# Patient Record
Sex: Female | Born: 1985 | Race: White | Marital: Single | Smoking: Never smoker
Health system: Southern US, Community
[De-identification: ages and names within clinical notes are randomized; demographics above are authoritative.]

---

## 2014-03-23 ENCOUNTER — Ambulatory Visit: Payer: Self-pay | Admitting: Physician Assistant

## 2014-03-23 LAB — RAPID STREP-A WITH REFLX: MICRO TEXT REPORT: NEGATIVE

## 2014-03-27 LAB — BETA STREP CULTURE(ARMC)

## 2017-07-01 ENCOUNTER — Ambulatory Visit
Admission: EM | Admit: 2017-07-01 | Discharge: 2017-07-01 | Disposition: A | Discharge status: Home / Self Care | Payer: BLUE CROSS/BLUE SHIELD | Attending: Family Medicine | Admitting: Family Medicine

## 2017-07-01 ENCOUNTER — Ambulatory Visit (INDEPENDENT_AMBULATORY_CARE_PROVIDER_SITE_OTHER): Payer: BLUE CROSS/BLUE SHIELD

## 2017-07-01 DIAGNOSIS — M545 Low back pain, unspecified: Secondary | ICD-10-CM

## 2017-07-01 MED ORDER — MELOXICAM 15 MG PO TABS: 15.0000 mg | ORAL_TABLET | Freq: Every day | ORAL | 0 refills | Status: AC | PRN

## 2017-07-01 MED ORDER — CYCLOBENZAPRINE HCL 5 MG PO TABS: 5.0000 mg | ORAL_TABLET | Freq: Two times a day (BID) | ORAL | 0 refills | Status: AC | PRN

## 2017-07-01 MED ORDER — KETOROLAC TROMETHAMINE 30 MG/ML IJ SOLN
30.0000 mg | Freq: Once | INTRAMUSCULAR | Status: AC
  Administered 2017-07-01: 30 mg via INTRAMUSCULAR

## 2017-07-01 NOTE — ED Triage Notes (Signed)
Pt states she was brushing her teeth this morning bending over the sink. Went to stand up and felt a "shock" states then she experienced nausea, eye sight went black and now having consistent back pain but the other symptoms disappeared. Hurts to sit, stand or any movement. No otc meds tried.

## 2017-07-01 NOTE — ED Provider Notes (Addendum)
[facility] ____________________________________________  Time seen: Approximately 2:44 PM  I have reviewed the triage vital signs and the nursing notes.   HISTORY  Chief Complaint Back Pain   HPI Valerie Leblanc is a 32 y.o. female presenting with family bedside for evaluation of low back pain that started this morning.  Patient reports that she was bent over brushing her teeth this morning, but when she went to stand up straight she felt sudden onset of pain in her low back.  Patient states that she then laid down and then with first attempt of getting up she had nausea and feeling like she might blackout, but no syncope or occurred.  Laid back down and waited a few minutes, and then attempted to get up again she did not have as much pain or near blacking out sensation.  States pain is primarily with position changes.  States if lying flat or standing straight minimal pain.  Has continued to remain ambulatory.  Has not tried anything over-the-counter today.  Denies pain radiation, paresthesias, urinary or bowel retention or incontinence, direct fall or direct trauma.  No rash, dysuria, fevers, recent sickness or other complaints.  Reports otherwise feels well.  Denies history of the same.  States does have some history of intermittent low back pain that is only present after overexertion.  Denies any current chest pain or shortness of breath, vision changes, headache, weakness, dizziness, or other complaints.  Patient's last menstrual period was 06/26/2017 (within days).Denies pregnancy.  History reviewed. No pertinent past medical history.  There are no active problems to display for this patient.   History reviewed. No pertinent surgical history.   No current facility-administered medications for this encounter.   Current Outpatient Medications:  .  cyclobenzaprine (FLEXERIL) 5 MG tablet, Take 1 tablet (5 mg total) by mouth 2 (two) times daily as needed for muscle spasms.  Do not drive as can cause drowsiness., Disp: 14 tablet, Rfl: 0 .  meloxicam (MOBIC) 15 MG tablet, Take 1 tablet (15 mg total) by mouth daily as needed., Disp: 10 tablet, Rfl: 0  Allergies Patient has no known allergies.  No family history on file.  Social History Social History   Tobacco Use  . Smoking status: Not on file  Substance Use Topics  . Alcohol use: Not on file  . Drug use: Not on file    Review of Systems Constitutional: No fever/chills Eyes: No visual changes. Cardiovascular: Denies chest pain. Respiratory: Denies shortness of breath. Gastrointestinal: No abdominal pain.  No nausea, no vomiting.  No diarrhea.  No constipation. Genitourinary: Negative for dysuria. Musculoskeletal: positive for back pain. Skin: Negative for rash. Neurological: Negative for headaches, focal weakness or numbness.   ____________________________________________   PHYSICAL EXAM:  VITAL SIGNS: ED Triage Vitals  Enc Vitals Group     BP 07/01/17 1347 (!) 82/55     Pulse Rate 07/01/17 1347 81     Resp 07/01/17 1347 18     Temp 07/01/17 1347 97.9 F (36.6 C)     Temp Source 07/01/17 1347 Oral     SpO2 07/01/17 1347 97 %     Weight --      Height --      Head Circumference --      Peak Flow --      Pain Score 07/01/17 1349 3     Pain Loc --      Pain Edu? --      Excl. in GC? --  Vitals:   07/01/17 1347 07/01/17 1559 07/01/17 1600  BP: (!) 82/55  (!) 98/56  Pulse: 81    Resp: 18    Temp: 97.9 F (36.6 C)    TempSrc: Oral    SpO2: 97%      Constitutional: Alert and oriented. Well appearing and in no acute distress. Eyes: Conjunctivae are normal. ENT      Head: Normocephalic and atraumatic.      Mouth/Throat: Mucous membranes are moist.Oropharynx non-erythematous. Neck: No stridor. Supple without meningismus.  Hematological/Lymphatic/Immunilogical: No cervical lymphadenopathy. Cardiovascular: Normal rate, regular rhythm. Grossly normal heart sounds.  Good  peripheral circulation. Respiratory: Normal respiratory effort without tachypnea nor retractions. Breath sounds are clear and equal bilaterally. No wheezes, rales, rhonchi. Gastrointestinal: Soft and nontender.  Normal Bowel sounds. No CVA tenderness. Musculoskeletal:  No midline cervical or thoracic tenderness to palpation. Bilateral pedal pulses equal and easily palpated.  2+ patellar and Achilles reflexes bilaterally.      Right lower leg:  No tenderness or edema.      Left lower leg:  No tenderness or edema.  Except midline: Lower lumbar at approximately lumbar 3-5 mild diffuse tenderness to palpation as well as left paralumbar tenderness, no rash, no swelling, no ecchymosis, no saddle anesthesia, moderate pain with supine to sitting positions, pain with bilateral straight leg raises left greater than right, minimal pain with standing knee lifts, able to ambulate with a steady gait with minimal pain per patient.  Neurologic:  Normal speech and language. No gross focal neurologic deficits are appreciated. Speech is normal. No gait instability.  No paresthesias. Skin:  Skin is warm, dry and intact. No rash noted. Psychiatric: Mood and affect are normal. Speech and behavior are normal. Patient exhibits appropriate insight and judgment   ___________________________________________   LABS (all labs ordered are listed, but only abnormal results are displayed)  Labs Reviewed - No data to display ____________________________________________  RADIOLOGY  Dg Lumbar Spine Complete  Result Date: 07/01/2017 CLINICAL DATA:  Woke up this morning with low back pain acutely. No acute injury. EXAM: LUMBAR SPINE - COMPLETE 4+ VIEW COMPARISON:  None. FINDINGS: 5 lumbar type vertebral bodies. The alignment is normal aside from a minimal convex left scoliosis. The disc spaces are preserved. No evidence of acute fracture pars defect. Small nonspecific mid abdominal calcifications bilaterally. IMPRESSION: No acute  lumbar spine findings or significant spondylosis. Nonspecific small mid abdominal calcifications bilaterally. Electronically Signed   By: Carey Bullocks M.D.   On: 07/01/2017 15:32   ____________________________________________   PROCEDURES Procedures     INITIAL IMPRESSION / ASSESSMENT AND PLAN / ED COURSE  Pertinent labs & imaging results that were available during my care of the patient were reviewed by me and considered in my medical decision making (see chart for details).  Well-appearing patient.  No acute distress.  No focal neurological deficits.  Minimal to no pain per patient with lying flight or standing and ambulation.  Reports pain with position changes.  No direct trauma.  Will evaluate lumbar x-ray as well as 30 mg Toradol given once.  Discussed multiple differentials, suspect strain inflammatory injury.  Also discussed concern of disc issue.  Lumbar x-ray result as above per radiologist, no acute lumbar spine findings.  Will treat with oral Mobic, PRN Flexeril.  Encouraged rest, fluids, supportive care.  Follow-up with primary care at [facility] primary or orthopedic as needed for continued pain.  Discussed strict follow-up and return parameters.Discussed indication, risks and benefits of medications with  patient.  Discussed follow up with Primary care physician this week. Discussed follow up and return parameters including no resolution or any worsening concerns. Patient verbalized understanding and agreed to plan.   ____________________________________________   FINAL CLINICAL IMPRESSION(S) / ED DIAGNOSES  Final diagnoses:  Low back pain without sciatica, unspecified back pain laterality, unspecified chronicity     ED Discharge Orders        Ordered    meloxicam (MOBIC) 15 MG tablet  Daily PRN     07/01/17 1558    cyclobenzaprine (FLEXERIL) 5 MG tablet  2 times daily PRN     07/01/17 1558       Note: This dictation was prepared with Dragon dictation along with  smaller phrase technology. Any transcriptional errors that result from this process are unintentional.         Renford Dills, NP 07/01/17 [phone]

## 2017-07-01 NOTE — Discharge Instructions (Addendum)
Take medication as prescribed. Rest. Drink plenty of fluids.   Follow up with your primary care physician or orthopedic this week as needed. Return to Urgent care or Emergency room for new or worsening concerns.

## 2018-12-10 ENCOUNTER — Ambulatory Visit: Payer: Self-pay

## 2018-12-10 ENCOUNTER — Other Ambulatory Visit: Payer: Self-pay

## 2018-12-10 VITALS — BP 108/82 | HR 78 | Resp 14 | Ht 66.14 in | Wt 139.0 lb

## 2018-12-10 DIAGNOSIS — Z23 Encounter for immunization: Secondary | ICD-10-CM

## 2018-12-10 DIAGNOSIS — Z008 Encounter for other general examination: Secondary | ICD-10-CM

## 2018-12-10 LAB — POCT LIPID PANEL
HDL: 114
LDL: 75
Non-HDL: 103
POC Glucose: 151 mg/dl — AB (ref 70–99)
TC/HDL: 1.9
TC: 217
TRG: 138

## 2018-12-10 NOTE — Patient Instructions (Signed)
Influenza (Flu) Vaccine (Inactivated or Recombinant): What You Need to Know 1. Why get vaccinated? Influenza vaccine can prevent influenza (flu). Flu is a contagious disease that spreads around the [place] every year, usually between October and May. Anyone can get the flu, but it is more dangerous for some people. Infants and young children, people 34 years of age and older, pregnant women, and people with certain health conditions or a weakened immune system are at greatest risk of flu complications. Pneumonia, bronchitis, sinus infections and ear infections are examples of flu-related complications. If you have a medical condition, such as heart disease, cancer or diabetes, flu can make it worse. Flu can cause fever and chills, sore throat, muscle aches, fatigue, cough, headache, and runny or stuffy nose. Some people may have vomiting and diarrhea, though this is more common in children than adults. Each year thousands of people in the [place] die from flu, and many more are hospitalized. Flu vaccine prevents millions of illnesses and flu-related visits to the doctor each year. 2. Influenza vaccine CDC recommends everyone 6 months of age and older get vaccinated every flu season. Children 6 months through 8 years of age may need 2 doses during a single flu season. Everyone else needs only 1 dose each flu season. It takes about 2 weeks for protection to develop after vaccination. There are many flu viruses, and they are always changing. Each year a new flu vaccine is made to protect against three or four viruses that are likely to cause disease in the upcoming flu season. Even when the vaccine doesn't exactly match these viruses, it may still provide some protection. Influenza vaccine does not cause flu. Influenza vaccine may be given at the same time as other vaccines. 3. Talk with your health care provider Tell your vaccine provider if the person getting the vaccine:  Has had an  allergic reaction after a previous dose of influenza vaccine, or has any severe, life-threatening allergies.  Has ever had Guillain-Barr Syndrome (also called GBS). In some cases, your health care provider may decide to postpone influenza vaccination to a future visit. People with minor illnesses, such as a cold, may be vaccinated. People who are moderately or severely ill should usually wait until they recover before getting influenza vaccine. Your health care provider can give you more information. 4. Risks of a vaccine reaction  Soreness, redness, and swelling where shot is given, fever, muscle aches, and headache can happen after influenza vaccine.  There may be a very small increased risk of Guillain-Barr Syndrome (GBS) after inactivated influenza vaccine (the flu shot). Young children who get the flu shot along with pneumococcal vaccine (PCV13), and/or DTaP vaccine at the same time might be slightly more likely to have a seizure caused by fever. Tell your health care provider if a child who is getting flu vaccine has ever had a seizure. People sometimes faint after medical procedures, including vaccination. Tell your provider if you feel dizzy or have vision changes or ringing in the ears. As with any medicine, there is a very remote chance of a vaccine causing a severe allergic reaction, other serious injury, or death. 5. What if there is a serious problem? An allergic reaction could occur after the vaccinated person leaves the clinic. If you see signs of a severe allergic reaction (hives, swelling of the face and throat, difficulty breathing, a fast heartbeat, dizziness, or weakness), call 9-1-1 and get the person to the nearest hospital. For other signs that   concern you, call your health care provider. Adverse reactions should be reported to the Vaccine Adverse Event Reporting System (VAERS). Your health care provider will usually file this report, or you can do it yourself. Visit the  VAERS website at www.vaers.hhs.gov or call 1-800-822-7967.VAERS is only for reporting reactions, and VAERS staff do not give medical advice. 6. The National Vaccine Injury Compensation Program The National Vaccine Injury Compensation Program (VICP) is a federal program that was created to compensate people who may have been injured by certain vaccines. Visit the VICP website at www.hrsa.gov/vaccinecompensation or call 1-800-338-2382 to learn about the program and about filing a claim. There is a time limit to file a claim for compensation. 7. How can I learn more?  Ask your healthcare provider.  Call your local or state health department.  Contact the Centers for Disease Control and Prevention (CDC): ? Call 1-800-232-4636 (1-800-CDC-INFO) or ? Visit CDC's www.cdc.gov/flu Vaccine Information Statement (Interim) Inactivated Influenza Vaccine (10/11/2017) This information is not intended to replace advice given to you by your health care provider. Make sure you discuss any questions you have with your health care provider. Document Released: 12/08/2005 Document Revised: 06/04/2018 Document Reviewed: 10/15/2017 Elsevier Patient Education  2020 Elsevier Inc. Preventing Influenza, Adult Influenza, more commonly known as "the flu," is a viral infection that mainly affects the respiratory tract. The respiratory tract includes structures that help you breathe, such as the lungs, nose, and throat. The flu causes many common cold symptoms, as well as a high fever and body aches. The flu spreads easily from person to person (is contagious). The flu is most common from December through March. This is called flu season.You can catch the flu virus by:  Breathing in droplets from an infected person's cough or sneeze.  Touching something that was recently contaminated with the virus and then touching your mouth, nose, or eyes. What can I do to lower my risk?        You can decrease your risk of getting  the flu by:  Getting a flu shot (influenza vaccination) every year. This is the best way to prevent the flu. A flu shot is recommended for everyone age 6 months and older. ? It is best to get a flu shot in the fall, as soon as it is available. Getting a flu shot during winter or spring instead is still a good idea. Flu season can last into early spring. ? Preventing the flu through vaccination requires getting a new flu shot every year. This is because the flu virus changes slightly (mutates) from one year to the next. Even if a flu shot does not completely protect you from all flu virus mutations, it can reduce the severity of your illness and prevent dangerous complications of the flu. ? If you are pregnant, you can and should get a flu shot. ? If you have had a reaction to the shot in the past or if you are allergic to eggs, check with your health care provider before getting a flu shot. ? Sometimes the vaccine is available as a nasal spray. In some years, the nasal spray has not been as effective against the flu virus. Check with your health care provider if you have questions about this.  Practicing good health habits. This is especially important during flu season. ? Avoid contact with people who are sick with flu or cold symptoms. ? Wash your hands with soap and water often. If soap and water are not available, use alcohol-based   hand sanitizer. ? Avoid touching your hands to your face, especially when you have not washed your hands recently. ? Use a disinfectant to clean surfaces at home and at work that may be contaminated with the flu virus. ? Keep your body's disease-fighting system (immune system) in good shape by eating a healthy diet, drinking plenty of fluids, getting enough sleep, and exercising regularly. If you do get the flu, avoid spreading it to others by:  Staying home until your symptoms have been gone for at least one day.  Covering your mouth and nose when you cough or  sneeze.  Avoiding close contact with others, especially babies and elderly people. Why are these changes important? Getting a flu shot and practicing good health habits protects you as well as other people. If you get the flu, your friends, family, and co-workers are also at risk of getting it, because it spreads so easily to others. Each year, about 2 out of every 10 people get the flu. Having the flu can lead to complications, such as pneumonia, ear infection, and sinus infection. The flu also can be deadly, especially for babies, people older than age 34, and people who have serious long-term diseases. How is this treated? Most people recover from the flu by resting at home and drinking plenty of fluids. However, a prescription antiviral medicine may reduce your flu symptoms and may make your flu go away sooner. This medicine must be started within a few days of getting flu symptoms. You can talk with your health care provider about whether you need an antiviral medicine. Antiviral medicine may be prescribed for people who are at risk for more serious flu symptoms. This includes people who:  Are older than age 34.  Are pregnant.  Have a condition that makes the flu worse or more dangerous. Where to find more information  Centers for Disease Control and Prevention: www.cdc.gov/flu/index.htm  Flu.gov: www.flu.gov/prevention-vaccination  American Academy of Family Physicians: familydoctor.org/familydoctor/en/kids/vaccines/preventing-the-flu.html Contact a health care provider if:  You have influenza and you develop new symptoms.  You have: ? Chest pain. ? Diarrhea. ? A fever.  Your cough gets worse, or you produce more mucus. Summary  The best way to prevent the flu is to get a flu shot every year in the fall.  Even if you get the flu after you have received the yearly vaccine, your flu may be milder and go away sooner because of your flu shot.  If you get the flu, antiviral  medicines that are started with a few days of symptoms may reduce your flu symptoms and may make your flu go away sooner.  You can also help prevent the flu by practicing good health habits. This information is not intended to replace advice given to you by your health care provider. Make sure you discuss any questions you have with your health care provider. Document Released: 02/28/2015 Document Revised: 01/26/2017 Document Reviewed: 10/23/2015 Elsevier Patient Education  2020 Elsevier Inc.  

## 2018-12-24 ENCOUNTER — Other Ambulatory Visit: Payer: Self-pay

## 2018-12-24 DIAGNOSIS — Z008 Encounter for other general examination: Secondary | ICD-10-CM

## 2018-12-24 LAB — POCT LIPID PANEL
Glucose Fasting, POC: 95 mg/dL (ref 70–99)
HDL: 97
LDL: 92
Non-HDL: 104
TC/HDL: 2.1
TC: 201
TRG: 61

## 2018-12-24 NOTE — Progress Notes (Signed)
     Patient ID: Valerie Leblanc, female    DOB: [birth year], 33 y.o.   MRN: [id number]    Thank you!!  [facility] [place]: [phone]  Cell:  [phone] Website: [email or web address]

## 2020-02-28 IMAGING — CR DG LUMBAR SPINE COMPLETE 4+V
5 series · 5 of 5 positions shown · non-contrast
Comparison: None.

CLINICAL DATA: Woke up this morning with low back pain acutely. No
acute injury.

EXAM:
LUMBAR SPINE - COMPLETE 4+ VIEW

[l-spine ap]
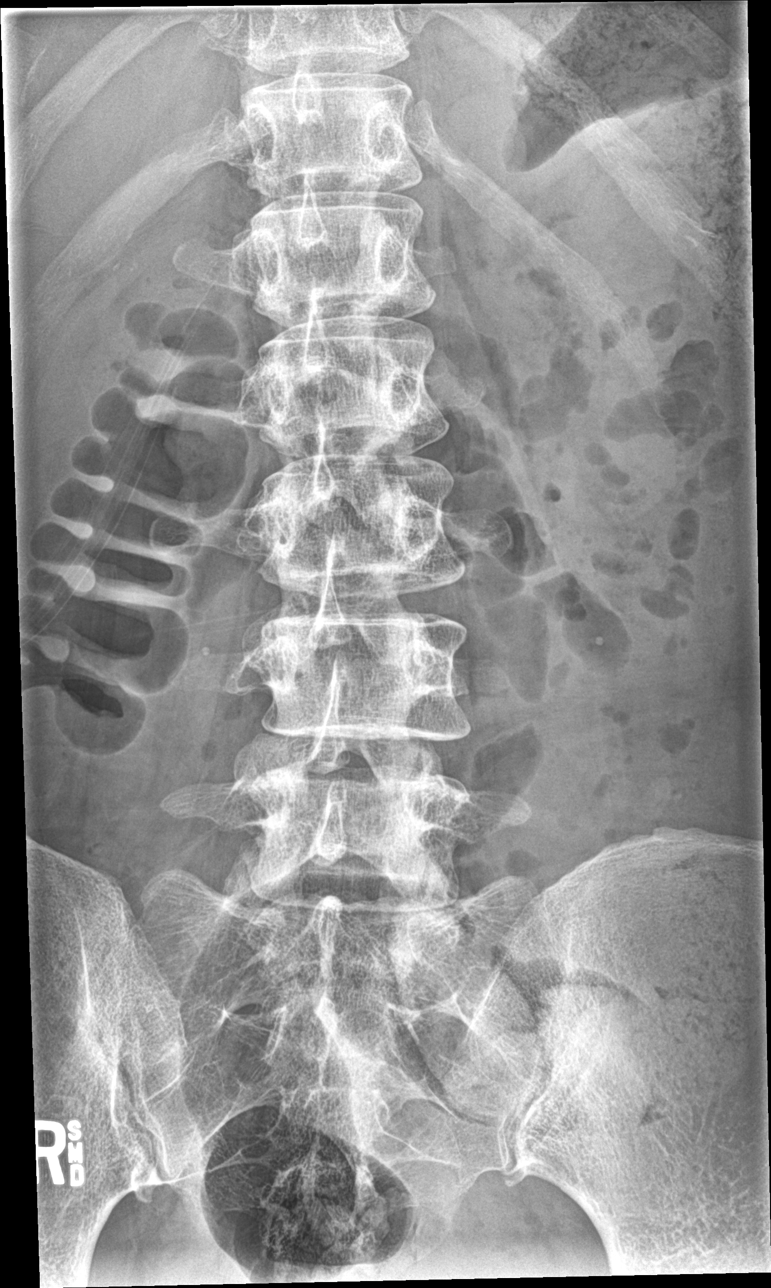

[l-spine obl (1 of 2)]
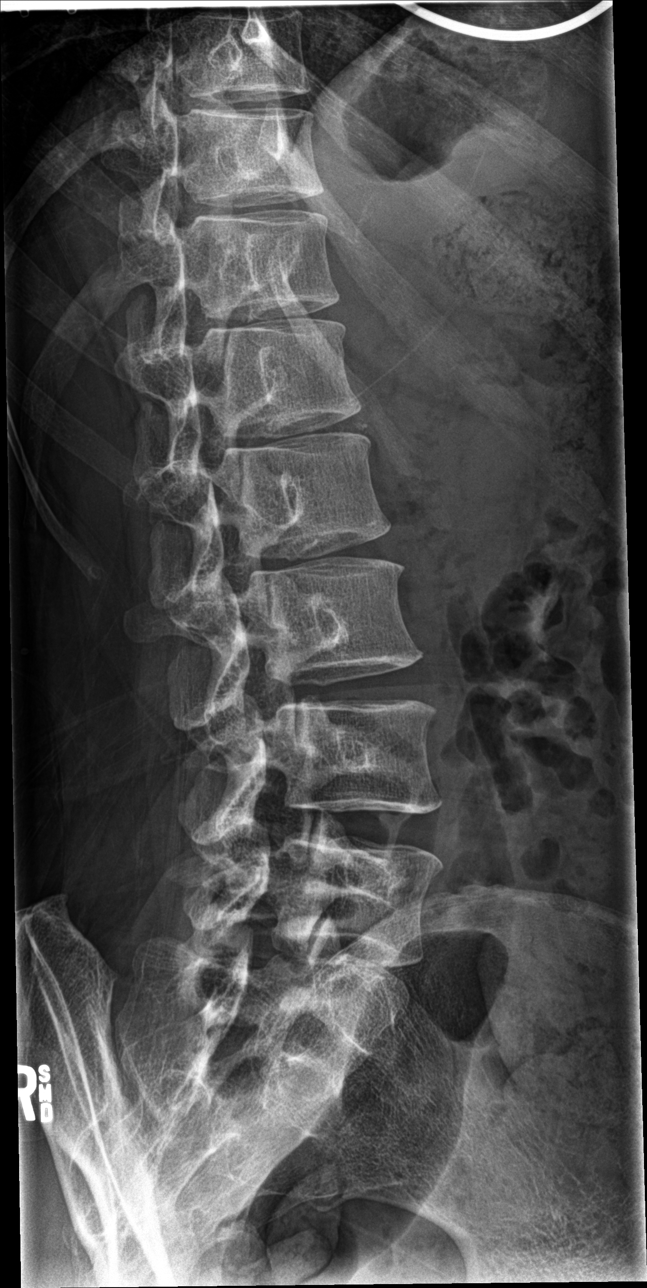

[l-spine obl (2 of 2)]
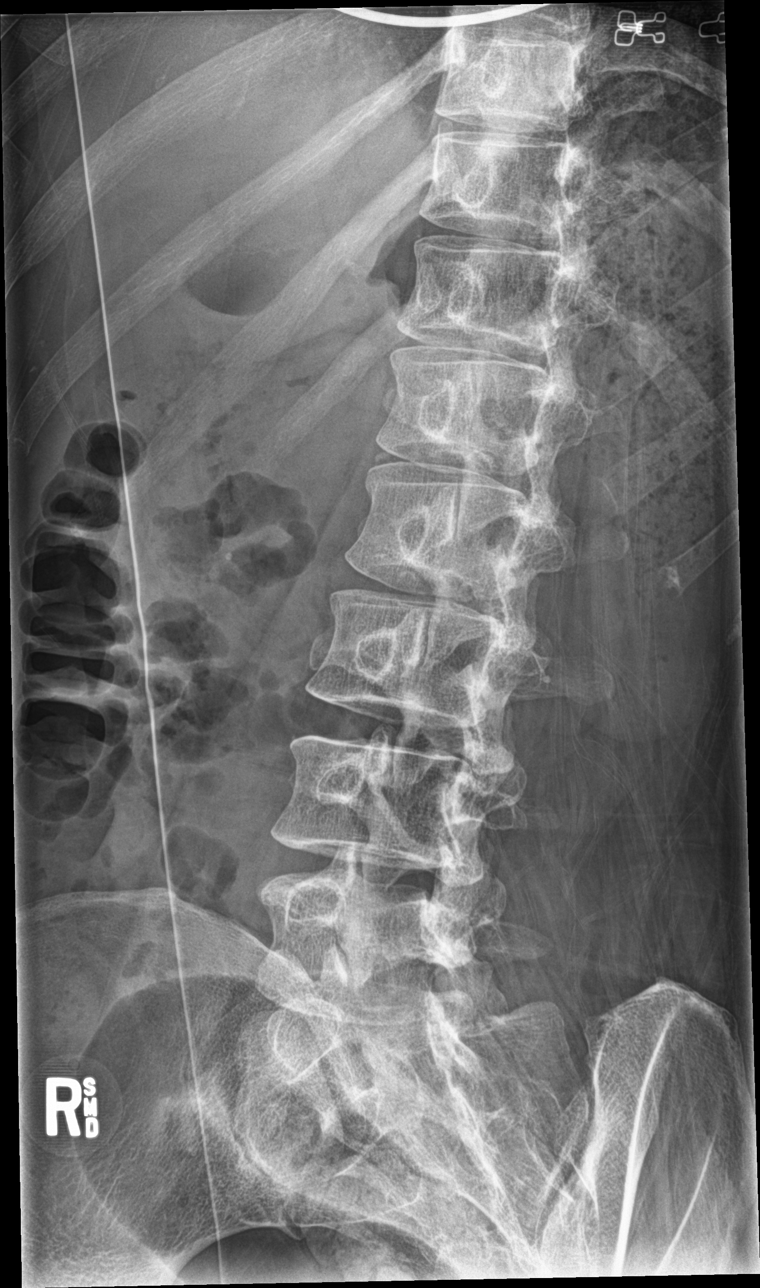

[l-spine lat]
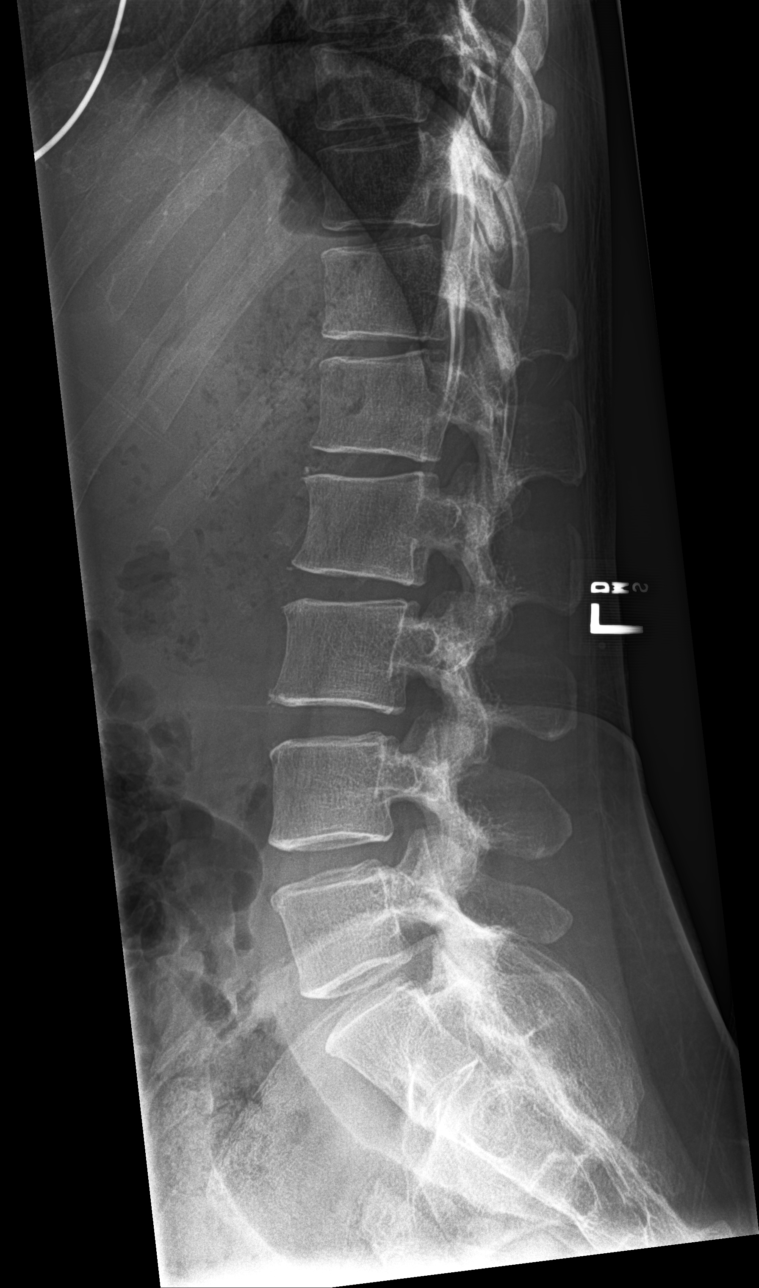

[l-spine spot]
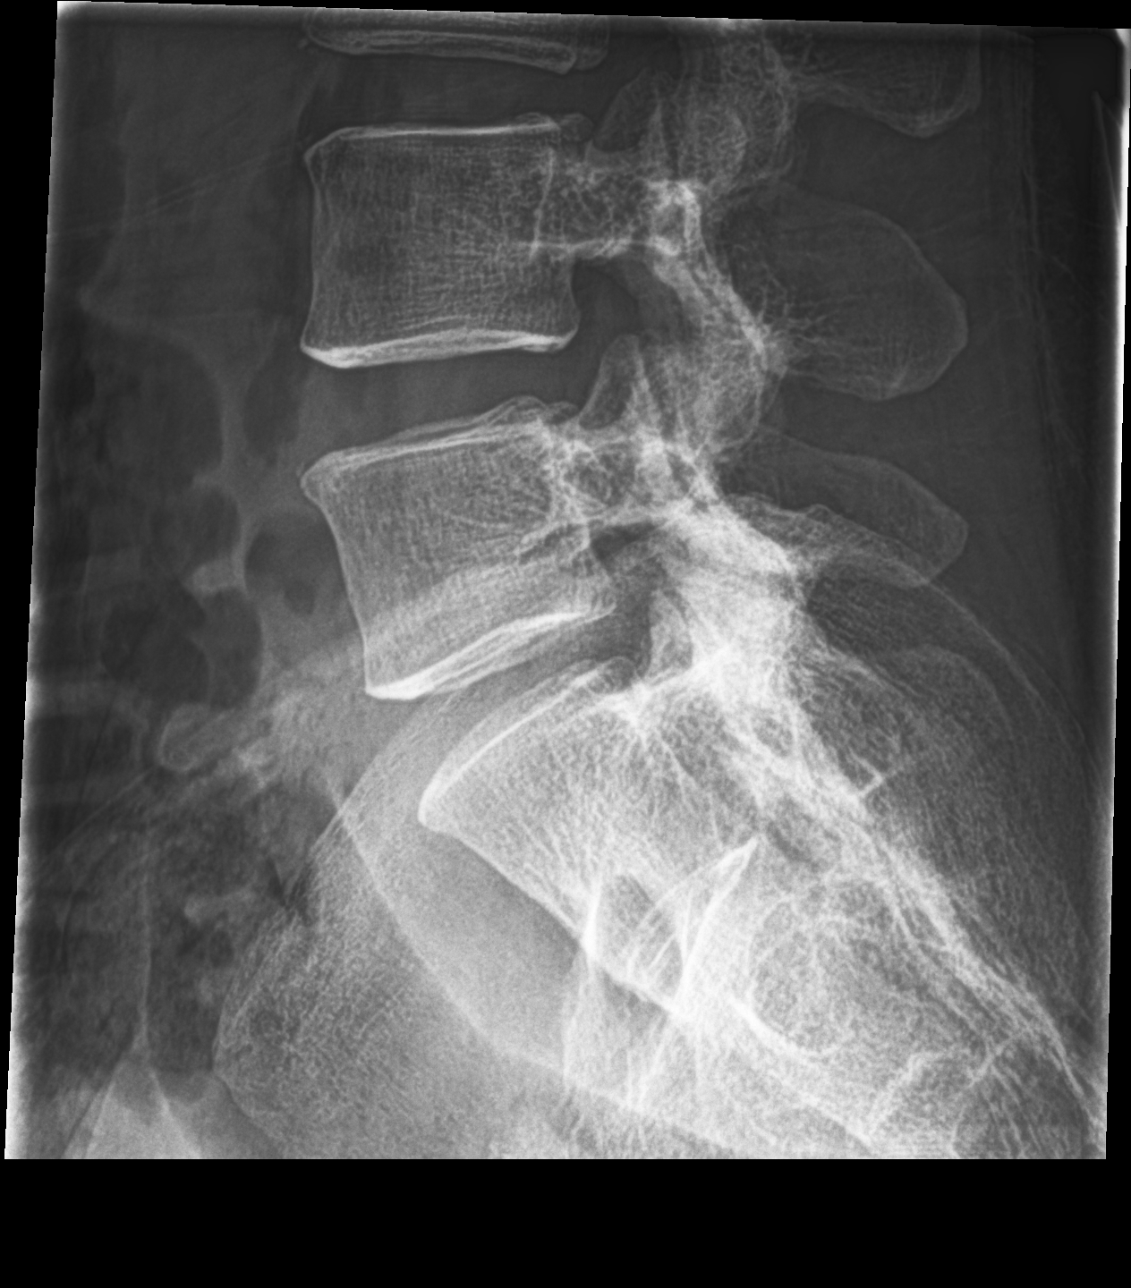

[5 of 5 positions shown; findings below may reference images not displayed]

FINDINGS: 5 lumbar type vertebral bodies. The alignment is normal aside from a
minimal convex left scoliosis. The disc spaces are preserved. No
evidence of acute fracture pars defect. Small nonspecific mid
abdominal calcifications bilaterally.
IMPRESSION: No acute lumbar spine findings or significant spondylosis.
Nonspecific small mid abdominal calcifications bilaterally.

## 2020-04-03 ENCOUNTER — Ambulatory Visit
Admission: EM | Admit: 2020-04-03 | Discharge: 2020-04-03 | Disposition: A | Discharge status: Home / Self Care | Payer: BC Managed Care – PPO | Attending: Sports Medicine | Admitting: Sports Medicine

## 2020-04-03 ENCOUNTER — Other Ambulatory Visit: Payer: Self-pay

## 2020-04-03 ENCOUNTER — Encounter: Payer: Self-pay | Admitting: Emergency Medicine

## 2020-04-03 DIAGNOSIS — Z3202 Encounter for pregnancy test, result negative: Secondary | ICD-10-CM | POA: Diagnosis not present

## 2020-04-03 DIAGNOSIS — R7989 Other specified abnormal findings of blood chemistry: Secondary | ICD-10-CM | POA: Diagnosis not present

## 2020-04-03 DIAGNOSIS — Z8759 Personal history of other complications of pregnancy, childbirth and the puerperium: Secondary | ICD-10-CM | POA: Insufficient documentation

## 2020-04-03 DIAGNOSIS — R509 Fever, unspecified: Secondary | ICD-10-CM | POA: Diagnosis not present

## 2020-04-03 DIAGNOSIS — R519 Headache, unspecified: Secondary | ICD-10-CM

## 2020-04-03 DIAGNOSIS — R1031 Right lower quadrant pain: Secondary | ICD-10-CM | POA: Insufficient documentation

## 2020-04-03 DIAGNOSIS — U071 COVID-19: Secondary | ICD-10-CM | POA: Insufficient documentation

## 2020-04-03 DIAGNOSIS — R300 Dysuria: Secondary | ICD-10-CM | POA: Diagnosis not present

## 2020-04-03 LAB — CBC WITH DIFFERENTIAL/PLATELET
Abs Immature Granulocytes: 0.01 10*3/uL (ref 0.00–0.07)
Basophils Absolute: 0 10*3/uL (ref 0.0–0.1)
Basophils Relative: 0 %
Eosinophils Absolute: 0.1 10*3/uL (ref 0.0–0.5)
Eosinophils Relative: 2 %
HCT: 35.7 % — ABNORMAL LOW (ref 36.0–46.0)
Hemoglobin: 11.9 g/dL — ABNORMAL LOW (ref 12.0–15.0)
Immature Granulocytes: 0 %
Lymphocytes Relative: 20 %
Lymphs Abs: 0.9 10*3/uL (ref 0.7–4.0)
MCH: 30.4 pg (ref 26.0–34.0)
MCHC: 33.3 g/dL (ref 30.0–36.0)
MCV: 91.1 fL (ref 80.0–100.0)
Monocytes Absolute: 0.3 10*3/uL (ref 0.1–1.0)
Monocytes Relative: 7 %
Neutro Abs: 3.2 10*3/uL (ref 1.7–7.7)
Neutrophils Relative %: 71 %
Platelets: 286 10*3/uL (ref 150–400)
RBC: 3.92 MIL/uL (ref 3.87–5.11)
RDW: 13.2 % (ref 11.5–15.5)
WBC: 4.6 10*3/uL (ref 4.0–10.5)
nRBC: 0 % (ref 0.0–0.2)

## 2020-04-03 LAB — COMPREHENSIVE METABOLIC PANEL
ALT: 14 U/L (ref 0–44)
AST: 14 U/L — ABNORMAL LOW (ref 15–41)
Albumin: 4.2 g/dL (ref 3.5–5.0)
Alkaline Phosphatase: 45 U/L (ref 38–126)
Anion gap: 11 (ref 5–15)
BUN: 10 mg/dL (ref 6–20)
CO2: 23 mmol/L (ref 22–32)
Calcium: 9.4 mg/dL (ref 8.9–10.3)
Chloride: 100 mmol/L (ref 98–111)
Creatinine, Ser: 0.53 mg/dL (ref 0.44–1.00)
GFR, Estimated: >60 mL/min (ref >60)
Glucose, Bld: 105 mg/dL — ABNORMAL HIGH (ref 70–99)
Potassium: 4 mmol/L (ref 3.5–5.1)
Sodium: 134 mmol/L — ABNORMAL LOW (ref 135–145)
Total Bilirubin: 0.5 mg/dL (ref 0.3–1.2)
Total Protein: 7.8 g/dL (ref 6.5–8.1)

## 2020-04-03 LAB — URINALYSIS, COMPLETE (UACMP) WITH MICROSCOPIC
Bilirubin Urine: NEGATIVE
Glucose, UA: NEGATIVE mg/dL
Ketones, ur: NEGATIVE mg/dL
Nitrite: NEGATIVE
Protein, ur: NEGATIVE mg/dL
Specific Gravity, Urine: 1.02 (ref 1.005–1.030)
pH: 7 (ref 5.0–8.0)

## 2020-04-03 LAB — GROUP A STREP BY PCR: Group A Strep by PCR: NOT DETECTED

## 2020-04-03 LAB — RAPID INFLUENZA A&B ANTIGENS
Influenza A (ARMC): NEGATIVE
Influenza B (ARMC): NEGATIVE

## 2020-04-03 LAB — HCG, QUANTITATIVE, PREGNANCY: hCG, Beta Chain, Quant, S: 44 m[IU]/mL — ABNORMAL HIGH (ref <5)

## 2020-04-03 NOTE — Discharge Instructions (Addendum)
Please go to the emergency room.  Your choice is [facility]. My concern is that you may have an acute appendicitis with right lower quadrant pain as well as fever to 101.3. In addition, with your recent miscarriage and your beta hCG still elevated, you need a higher level of care and your OB/GYN is at [facility].  Either way, you will need a CT scan of your abdomen and pelvis to rule out an appendicitis which should help with the potential that you have retained products from your miscarriage. Your Covid test is pending. Your UA is not overwhelmingly positive but I will send off a urine culture.  I hope you get to feeling better, Dr. Zachery Dauer

## 2020-04-03 NOTE — ED Notes (Signed)
Patient is being discharged from the Urgent Care and sent to the [facility] Emergency Department via private vehicle . Per Dr. Zachery Dauer, patient is in need of higher level of care due to evaluation. Patient is aware and verbalizes understanding of plan of care.  Vitals:   04/03/20 1425  BP: 123/83  Pulse: (!) 103  Resp: 14  Temp: 99.3 F (37.4 C)  SpO2: 100%

## 2020-04-03 NOTE — ED Provider Notes (Signed)
[facility]    CSN: [id number] Arrival date & time: 04/03/20  1414      History   Chief Complaint Chief Complaint  Patient presents with  . Sore Throat  . Abdominal Pain  . Headache    HPI Valerie Leblanc is a 35 y.o. female.   Patient is a pleasant 35 year old female who presents for evaluation of multiple issues.  The first issue is right lower quadrant pain which she has had for about 3 to 4 days.  It seems to be worse with urination.  Complicating her situation is she has had a T-max of 101.3.  It appears as though her right lower quadrant pain is worse with urination but not relieved with urination.  She denies any abdominal surgeries.  She denies any constipation or diarrhea.  She is passing gas and her last bowel movement was earlier today.  She is noted no hematuria.  There is dysuria though.  Complicating her situation is that she had a recent miscarriage and is being followed by [facility].  The miscarriage was on 03/20/2020.  She was discharged on 03/23/2020 with no follow-up.  Her second issue is a headache and scratchy throat.  Again her T-max is 101.3.  She denies any chest pain or shortness of breath.  No myalgias or congestion.  No nausea vomiting or diarrhea.  She has been vaccinated against Covid x2.  She did not get the flu shot.  No known Covid exposure and no known Covid history.  No significant cough, ear pain, or significant respiratory issues.     History reviewed. No pertinent past medical history.  There are no problems to display for this patient.   History reviewed. No pertinent surgical history.  OB History   No obstetric history on file.      Home Medications    Prior to Admission medications   Medication Sig Start Date End Date Taking? Authorizing Provider  cyclobenzaprine (FLEXERIL) 5 MG tablet Take 1 tablet (5 mg total) by mouth 2 (two) times daily as needed for muscle spasms. Do not drive as can cause drowsiness. 07/01/17   Renford Dills, NP  meloxicam (MOBIC) 15 MG tablet Take 1 tablet (15 mg total) by mouth daily as needed. 07/01/17   Renford Dills, NP    Family History History reviewed. No pertinent family history.  Social History Social History   Tobacco Use  . Smoking status: Never Smoker  . Smokeless tobacco: Never Used  Vaping Use  . Vaping Use: Never used  Substance Use Topics  . Alcohol use: Yes  . Drug use: Never     Allergies   Patient has no known allergies.   Review of Systems Review of Systems  Constitutional: Positive for chills, fatigue and fever.  HENT: Positive for sore throat. Negative for congestion, ear pain, postnasal drip, rhinorrhea, sinus pressure, sinus pain and sneezing.   Eyes: Negative for pain.  Respiratory: Negative for cough, chest tightness, shortness of breath, wheezing and stridor.   Cardiovascular: Negative for chest pain and palpitations.  Gastrointestinal: Positive for abdominal pain. Negative for blood in stool, constipation, diarrhea, nausea and vomiting.  Genitourinary: Positive for dysuria, frequency, pelvic pain and urgency. Negative for flank pain, hematuria, vaginal bleeding, vaginal discharge and vaginal pain.  Musculoskeletal: Negative for back pain, myalgias and neck pain.  Skin: Negative for color change, pallor, rash and wound.  Neurological: Positive for headaches. Negative for dizziness, syncope, light-headedness and numbness.  All other systems reviewed and  are negative.    Physical Exam Triage Vital Signs ED Triage Vitals  Enc Vitals Group     BP 04/03/20 1425 123/83     Pulse Rate 04/03/20 1425 (!) 103     Resp 04/03/20 1425 14     Temp 04/03/20 1425 99.3 F (37.4 C)     Temp Source 04/03/20 1425 Oral     SpO2 04/03/20 1425 100 %     Weight 04/03/20 1422 138 lb 14.2 oz (63 kg)     Height 04/03/20 1422 5\' 6"  (1.676 m)     Head Circumference --      Peak Flow --      Pain Score 04/03/20 1422 2     Pain Loc --      Pain Edu? --       Excl. in GC? --    No data found.  Updated Vital Signs BP 123/83 (BP Location: Left Arm)   Pulse (!) 103   Temp 99.3 F (37.4 C) (Oral)   Resp 14   Ht 5\' 6"  (1.676 m)   Wt 63 kg   SpO2 100%   BMI 22.42 kg/m   Visual Acuity Right Eye Distance:   Left Eye Distance:   Bilateral Distance:    Right Eye Near:   Left Eye Near:    Bilateral Near:     Physical Exam Vitals and nursing note reviewed.  Constitutional:      General: She is not in acute distress.    Appearance: She is well-developed. She is not ill-appearing or toxic-appearing.  HENT:     Head: Normocephalic and atraumatic.     Right Ear: Tympanic membrane normal.     Left Ear: Tympanic membrane normal.     Nose: No congestion or rhinorrhea.     Mouth/Throat:     Mouth: Mucous membranes are moist. No oral lesions.     Pharynx: Uvula midline. Posterior oropharyngeal erythema present. No pharyngeal swelling, oropharyngeal exudate or uvula swelling.     Tonsils: No tonsillar exudate or tonsillar abscesses. 1+ on the right. 1+ on the left.  Eyes:     Extraocular Movements: Extraocular movements intact.     Conjunctiva/sclera: Conjunctivae normal.     Pupils: Pupils are equal, round, and reactive to light.  Neck:     Thyroid: No thyromegaly.  Cardiovascular:     Rate and Rhythm: Normal rate and regular rhythm.     Heart sounds: Normal heart sounds. No murmur heard. No friction rub. No gallop.      Comments: Recheck heart rate was 96 bpm Pulmonary:     Effort: Pulmonary effort is normal. No respiratory distress.     Breath sounds: Normal breath sounds. No stridor. No wheezing, rhonchi or rales.  Abdominal:     General: Bowel sounds are increased. There is no distension or abdominal bruit. There are no signs of injury.     Palpations: Abdomen is soft. There is no fluid wave, hepatomegaly, splenomegaly or pulsatile mass.     Tenderness: There is abdominal tenderness in the right lower quadrant. There is guarding and  rebound. There is no right CVA tenderness or left CVA tenderness. Positive signs include McBurney's sign.  Musculoskeletal:     Cervical back: Normal range of motion and neck supple.  Lymphadenopathy:     Cervical: Cervical adenopathy present.  Skin:    General: Skin is warm and dry.     Capillary Refill: Capillary refill takes less than 2 seconds.  Neurological:     General: No focal deficit present.     Mental Status: She is alert and oriented to person, place, and time.     GCS: GCS eye subscore is 4. GCS verbal subscore is 5. GCS motor subscore is 6.     Cranial Nerves: No cranial nerve deficit.     Sensory: No sensory deficit.  Psychiatric:        Mood and Affect: Mood normal.        Speech: Speech normal.        Behavior: Behavior normal.      UC Treatments / Results  Labs (all labs ordered are listed, but only abnormal results are displayed) Labs Reviewed  URINALYSIS, COMPLETE (UACMP) WITH MICROSCOPIC - Abnormal; Notable for the following components:      Result Value   Hgb urine dipstick TRACE (*)    Leukocytes,Ua TRACE (*)    Bacteria, UA RARE (*)    All other components within normal limits  CBC WITH DIFFERENTIAL/PLATELET - Abnormal; Notable for the following components:   Hemoglobin 11.9 (*)    HCT 35.7 (*)    All other components within normal limits  COMPREHENSIVE METABOLIC PANEL - Abnormal; Notable for the following components:   Sodium 134 (*)    Glucose, Bld 105 (*)    AST 14 (*)    All other components within normal limits  HCG, QUANTITATIVE, PREGNANCY - Abnormal; Notable for the following components:   hCG, Beta Chain, Quant, S 44 (*)    All other components within normal limits  GROUP A STREP BY PCR  RAPID INFLUENZA A&B ANTIGENS  SARS CORONAVIRUS 2 (TAT 6-24 HRS)  URINE CULTURE    EKG   Radiology No results found.  Procedures Procedures (including critical care time)  Medications Ordered in UC Medications - No data to display  Initial  Impression / Assessment and Plan / UC Course  I have reviewed the triage vital signs and the nursing notes.  Pertinent labs & imaging results that were available during my care of the patient were reviewed by me and considered in my medical decision making (see chart for details).  Clinical impression: 1.  Right lower quadrant pain with concern about potential appendicitis. 2.  Recent miscarriage with right lower quadrant pain and a still elevated serum beta hCG. 3.  Headache with sore throat. 4.  Fever to 101.3.  Treatment plan: 1.  The findings and treatment plan were discussed in detail with the patient.  Patient was in agreement. 2.  Recommended getting some labs.  They are shown above.  CBC showed a normal white count.  H&H showed anemia.  CMP showed a mild increase in glucose at 105.  Sodium was 134 which was a little low.  UA showed leukocytes and bacteria and trace hemoglobin.  Beta hCG was elevated at 44. 3.  Recommended getting a COVID test given her febrile illness scratchy throat and headache it was pending at the time of discharge. 4.  Sent off a urine culture given the findings on her UA.  I did not treat her with antibiotics. 5.  I recommended getting a CT scan given her right lower quadrant pain and her recent miscarriage.  We discussed going to [facility] but they have their care at [facility] in [place] and I have advised them to go directly to the emergency room for further evaluation.  Her OB/GYN is there and they can better advise on the persistently elevated beta hCG.  In addition if she needs more care for her right lower quadrant pain her records are at [facility]. 6.  Gave her educational handouts. 7.  If her urine culture is positive then someone will contact her to put her on an antibiotic. 8.  Otherwise we will discharge her from care and she will go to [facility] ER for further evaluation and management.    Final Clinical Impressions(s) / UC Diagnoses    Final diagnoses:  Right lower quadrant abdominal pain  History of miscarriage, not currently pregnant  Fever, unspecified  Dysuria  Acute nonintractable headache, unspecified headache type     Discharge Instructions     Please go to the emergency room.  Your choice is [facility]. My concern is that you may have an acute appendicitis with right lower quadrant pain as well as fever to 101.3. In addition, with your recent miscarriage and your beta hCG still elevated, you need a higher level of care and your OB/GYN is at [facility].  Either way, you will need a CT scan of your abdomen and pelvis to rule out an appendicitis which should help with the potential that you have retained products from your miscarriage. Your Covid test is pending. Your UA is not overwhelmingly positive but I will send off a urine culture.  I hope you get to feeling better, Dr. Zachery Dauer    ED Prescriptions    None     PDMP not reviewed this encounter.   Delton See, MD 04/03/20 1740

## 2020-04-03 NOTE — ED Triage Notes (Addendum)
Patient c/o headache, lower abdominal and dysuria that started 4 days ago.  Patient reports cough and fever that last night.  Patient states that she had a miscariage on 03/20/20.  Patient states that she follows back up with OB/GYN on 04/23/20.

## 2020-04-04 LAB — SARS CORONAVIRUS 2 (TAT 6-24 HRS): SARS Coronavirus 2: POSITIVE — AB

## 2020-04-05 ENCOUNTER — Telehealth: Payer: Self-pay | Admitting: Physician Assistant

## 2020-04-05 LAB — URINE CULTURE
Culture: <10000 — AB
Special Requests: NORMAL

## 2020-04-05 NOTE — Telephone Encounter (Signed)
Called to discuss with Valerie Leblanc about Covid symptoms and the use of sotrovimab, remdisivir or oral therapies for those with mild to moderate Covid symptoms and at a high risk of hospitalization.     Pt is qualified due to co-morbid conditions and/or a member of an at-risk group (high SVI), however declines treatment at this time. Symptoms tier reviewed as well as criteria for ending isolation.  Symptoms reviewed that would warrant ED/Hospital evaluation. Preventative practices reviewed. Patient verbalized understanding. Patient advised to call back if he decides that he does want to get infusion. Callback number to the infusion center given. Patient advised to go to Urgent care or ED with severe symptoms. Last date pt would be eligible for infusion is 2/10.  Last date pt would be eligible for orals is 2/8.   There are no problems to display for this patient.   Cline Crock PA-C

## 2022-11-28 ENCOUNTER — Encounter: Payer: Self-pay | Admitting: Emergency Medicine

## 2022-11-28 ENCOUNTER — Ambulatory Visit
Admission: EM | Admit: 2022-11-28 | Discharge: 2022-11-28 | Disposition: A | Discharge status: Home / Self Care | Payer: BC Managed Care – PPO | Attending: Emergency Medicine | Admitting: Emergency Medicine

## 2022-11-28 DIAGNOSIS — N12 Tubulo-interstitial nephritis, not specified as acute or chronic: Secondary | ICD-10-CM | POA: Diagnosis present

## 2022-11-28 LAB — URINALYSIS, W/ REFLEX TO CULTURE (INFECTION SUSPECTED)
Bilirubin Urine: NEGATIVE
Glucose, UA: NEGATIVE mg/dL
Ketones, ur: 80 mg/dL — AB
Nitrite: POSITIVE — AB
Protein, ur: >300 mg/dL — AB
Specific Gravity, Urine: 1.015 (ref 1.005–1.030)
WBC, UA: >50 WBC/hpf (ref 0–5)
pH: 6.5 (ref 5.0–8.0)

## 2022-11-28 MED ORDER — CEFDINIR 300 MG PO CAPS: 300.0000 mg | ORAL_CAPSULE | Freq: Two times a day (BID) | ORAL | 0 refills | Status: AC

## 2022-11-28 NOTE — ED Triage Notes (Addendum)
Pt c/o dysuria, lower back pain, abdominal pain and fever x 3 days. Pt also has a cough x 3 days.

## 2022-11-28 NOTE — Discharge Instructions (Signed)
Take the cefdinir 300 mg twice daily with food for 10 days for treatment of your pyelonephritis/urinary tract infection.  Continue to take over-the-counter Tylenol and/or ibuprofen according to the package instructions as needed for fever.  If you develop any nausea or vomiting, increasing abdominal pain, or shaking chills you need to return for reevaluation or seek care in the emergency department.

## 2022-11-28 NOTE — ED Provider Notes (Signed)
[facility]    CSN: [id number] Arrival date & time: 11/28/22  4098      History   Chief Complaint Chief Complaint  Patient presents with   Fever   Dysuria   Back Pain   Abdominal Pain    HPI Valerie Leblanc is a 37 y.o. female.   HPI  37 year old female with no significant past medical history presents for evaluation of 3 days worth of burning with urination along with urinary urgency and frequency, suprapubic pain, and back pain.  She also reports a fever last night of 103.6.  She denies any blood in her urine, nausea, vomiting, or diarrhea.  She has also been experiencing a dry, nonproductive cough for the last 3 days but denies any upper respiratory symptoms, shortness of breath, or wheezing.  History reviewed. No pertinent past medical history.  There are no problems to display for this patient.   History reviewed. No pertinent surgical history.  OB History   No obstetric history on file.      Home Medications    Prior to Admission medications   Medication Sig Start Date End Date Taking? Authorizing Provider  cefdinir (OMNICEF) 300 MG capsule Take 1 capsule (300 mg total) by mouth 2 (two) times daily for 10 days. 11/28/22 12/08/22 Yes Becky Augusta, NP  diazepam (VALIUM) 5 MG tablet Place vaginally. 06/02/22 06/02/23 Yes [provider]  estradiol (ESTRACE) 0.1 MG/GM vaginal cream Place vaginally. 06/02/22 06/02/23 Yes [provider]  cyclobenzaprine (FLEXERIL) 5 MG tablet Take 1 tablet (5 mg total) by mouth 2 (two) times daily as needed for muscle spasms. Do not drive as can cause drowsiness. 07/01/17   Renford Dills, NP  meloxicam (MOBIC) 15 MG tablet Take 1 tablet (15 mg total) by mouth daily as needed. 07/01/17   Renford Dills, NP    Family History History reviewed. No pertinent family history.  Social History Social History   Tobacco Use   Smoking status: Never   Smokeless tobacco: Never  Vaping Use   Vaping status: Never Used   Substance Use Topics   Alcohol use: Yes   Drug use: Never     Allergies   Gramineae pollens and Tape   Review of Systems Review of Systems  Constitutional:  Positive for fever.  HENT:  Negative for congestion, ear pain, rhinorrhea and sore throat.   Respiratory:  Positive for cough. Negative for shortness of breath and wheezing.   Gastrointestinal:  Positive for abdominal pain. Negative for diarrhea, nausea and vomiting.  Genitourinary:  Positive for dysuria, frequency and urgency. Negative for hematuria, vaginal discharge and vaginal pain.  Musculoskeletal:  Positive for back pain.     Physical Exam Triage Vital Signs ED Triage Vitals  Encounter Vitals Group     BP      Systolic BP Percentile      Diastolic BP Percentile      Pulse      Resp      Temp      Temp src      SpO2      Weight      Height      Head Circumference      Peak Flow      Pain Score      Pain Loc      Pain Education      Exclude from Growth Chart    No data found.  Updated Vital Signs BP 110/81 (BP Location: Left Arm)   Pulse  96   Temp 99.5 F (37.5 C) (Oral)   Resp 16   LMP 11/16/2022   SpO2 97%   Visual Acuity Right Eye Distance:   Left Eye Distance:   Bilateral Distance:    Right Eye Near:   Left Eye Near:    Bilateral Near:     Physical Exam Vitals and nursing note reviewed.  Constitutional:      Appearance: Normal appearance. She is ill-appearing.  HENT:     Head: Normocephalic and atraumatic.  Cardiovascular:     Rate and Rhythm: Normal rate and regular rhythm.     Pulses: Normal pulses.     Heart sounds: Normal heart sounds. No murmur heard.    No friction rub. No gallop.  Pulmonary:     Effort: Pulmonary effort is normal.     Breath sounds: Normal breath sounds. No wheezing, rhonchi or rales.  Abdominal:     General: Abdomen is flat.     Palpations: Abdomen is soft.     Tenderness: There is abdominal tenderness. There is right CVA tenderness. There is no  left CVA tenderness, guarding or rebound.     Comments: Patient has suprapubic abdominal tenderness to palpation without guarding or rebound.  Remainder the abdomen is benign.  Positive CVA tenderness on the right.  Skin:    General: Skin is warm and dry.     Capillary Refill: Capillary refill takes less than 2 seconds.  Neurological:     General: No focal deficit present.     Mental Status: She is alert and oriented to person, place, and time.      UC Treatments / Results  Labs (all labs ordered are listed, but only abnormal results are displayed) Labs Reviewed  URINALYSIS, W/ REFLEX TO CULTURE (INFECTION SUSPECTED) - Abnormal; Notable for the following components:      Result Value   APPearance HAZY (*)    Hgb urine dipstick SMALL (*)    Ketones, ur 80 (*)    Protein, ur >300 (*)    Nitrite POSITIVE (*)    Leukocytes,Ua SMALL (*)    Bacteria, UA MANY (*)    All other components within normal limits  URINE CULTURE    EKG   Radiology No results found.  Procedures Procedures (including critical care time)  Medications Ordered in UC Medications - No data to display  Initial Impression / Assessment and Plan / UC Course  I have reviewed the triage vital signs and the nursing notes.  Pertinent labs & imaging results that were available during my care of the patient were reviewed by me and considered in my medical decision making (see chart for details).   Patient is a pleasant, though ill-appearing, 37 year old female presenting for evaluation of 3 days worth of urinary symptoms and also a nonproductive cough without associated upper respiratory symptoms.  She does endorse a fever of 39.8 last night which calculates out to 103.6.  She is currently afebrile with an oral temp of 99.5.  On exam she has clear lung sounds in all fields and abdomen is soft, flat, with suprapubic tenderness without guarding or rebound.  Positive CVA tenderness on the right.  Given patient's cluster  symptoms I am concerned for possible pyelonephritis.  I will do a urinalysis to evaluate for the presence of infection.  Urinalysis shows hazy appearance with small hemoglobin, 80 ketones, greater than 300 protein, nitrite positive with small leukocyte esterase.  Reflex microscopy shows 6-10 squamous epithelials with greater than  50 WBCs, 21-50 RBCs, and many bacteria.  I will send urine for culture as well not reflex due to the skin contamination.  I will discharge patient home with diagnosis of pyelonephritis and treat her with cefdinir 300 mg twice daily for 10 days.  Also Pyridium to up with urinary discomfort.  Patient should continue use Tylenol and/or ibuprofen as needed for fever or pain.  Any new or worsening symptoms she should return for reevaluation or seek care in the ER.   Final Clinical Impressions(s) / UC Diagnoses   Final diagnoses:  Pyelonephritis     Discharge Instructions      Take the cefdinir 300 mg twice daily with food for 10 days for treatment of your pyelonephritis/urinary tract infection.  Continue to take over-the-counter Tylenol and/or ibuprofen according to the package instructions as needed for fever.  If you develop any nausea or vomiting, increasing abdominal pain, or shaking chills you need to return for reevaluation or seek care in the emergency department.     ED Prescriptions     Medication Sig Dispense Auth. Provider   cefdinir (OMNICEF) 300 MG capsule Take 1 capsule (300 mg total) by mouth 2 (two) times daily for 10 days. 20 capsule Becky Augusta, NP      PDMP not reviewed this encounter.   Becky Augusta, NP 11/28/22 [phone]

## 2022-11-30 LAB — URINE CULTURE
Culture: >=100000 — AB
Special Requests: NORMAL
